# Patient Record
Sex: Female | Born: 1935 | Race: White | Hispanic: No
Health system: Southern US, Community
[De-identification: ages and names within clinical notes are randomized; demographics above are authoritative.]

## PROBLEM LIST (undated history)

## (undated) DIAGNOSIS — N289 Disorder of kidney and ureter, unspecified: Secondary | ICD-10-CM

## (undated) DIAGNOSIS — C801 Malignant (primary) neoplasm, unspecified: Secondary | ICD-10-CM

## (undated) DIAGNOSIS — I10 Essential (primary) hypertension: Secondary | ICD-10-CM

---

## 1997-12-16 ENCOUNTER — Other Ambulatory Visit: Admission: RE | Admit: 1997-12-16 | Discharge: 1997-12-16 | Payer: Self-pay | Admitting: Family Medicine

## 1998-06-04 ENCOUNTER — Ambulatory Visit (HOSPITAL_COMMUNITY): Admission: RE | Admit: 1998-06-04 | Discharge: 1998-06-04 | Payer: Self-pay | Admitting: Family Medicine

## 1998-06-04 ENCOUNTER — Encounter: Payer: Self-pay | Admitting: Family Medicine

## 1998-09-01 ENCOUNTER — Ambulatory Visit (HOSPITAL_COMMUNITY): Admission: RE | Admit: 1998-09-01 | Discharge: 1998-09-01 | Payer: Self-pay | Admitting: Obstetrics & Gynecology

## 1999-01-05 ENCOUNTER — Encounter: Admission: RE | Admit: 1999-01-05 | Discharge: 1999-04-05 | Payer: Self-pay | Admitting: Neurosurgery

## 1999-02-04 ENCOUNTER — Encounter: Payer: Self-pay | Admitting: Emergency Medicine

## 1999-02-04 ENCOUNTER — Emergency Department (HOSPITAL_COMMUNITY): Admission: EM | Admit: 1999-02-04 | Discharge: 1999-02-04 | Payer: Self-pay | Admitting: Emergency Medicine

## 1999-02-28 ENCOUNTER — Encounter: Admission: RE | Admit: 1999-02-28 | Discharge: 1999-02-28 | Payer: Self-pay | Admitting: Family Medicine

## 1999-06-08 ENCOUNTER — Ambulatory Visit (HOSPITAL_COMMUNITY): Admission: RE | Admit: 1999-06-08 | Discharge: 1999-06-08 | Payer: Self-pay | Admitting: Family Medicine

## 1999-06-08 ENCOUNTER — Encounter: Payer: Self-pay | Admitting: Family Medicine

## 2000-04-19 ENCOUNTER — Encounter (INDEPENDENT_AMBULATORY_CARE_PROVIDER_SITE_OTHER): Payer: Self-pay | Admitting: Specialist

## 2000-04-19 ENCOUNTER — Other Ambulatory Visit: Admission: RE | Admit: 2000-04-19 | Discharge: 2000-04-19 | Payer: Self-pay | Admitting: Gastroenterology

## 2000-06-13 ENCOUNTER — Ambulatory Visit (HOSPITAL_COMMUNITY): Admission: RE | Admit: 2000-06-13 | Discharge: 2000-06-13 | Payer: Self-pay | Admitting: Family Medicine

## 2000-06-13 ENCOUNTER — Encounter: Payer: Self-pay | Admitting: Family Medicine

## 2000-08-16 ENCOUNTER — Inpatient Hospital Stay (HOSPITAL_COMMUNITY): Admission: EM | Admit: 2000-08-16 | Discharge: 2000-08-20 | Payer: Self-pay | Admitting: Emergency Medicine

## 2000-08-16 ENCOUNTER — Encounter: Payer: Self-pay | Admitting: Emergency Medicine

## 2000-08-16 ENCOUNTER — Encounter: Payer: Self-pay | Admitting: Internal Medicine

## 2001-02-18 ENCOUNTER — Encounter: Payer: Self-pay | Admitting: Urology

## 2001-02-18 ENCOUNTER — Encounter: Admission: RE | Admit: 2001-02-18 | Discharge: 2001-02-18 | Payer: Self-pay | Admitting: Urology

## 2001-02-19 ENCOUNTER — Ambulatory Visit (HOSPITAL_BASED_OUTPATIENT_CLINIC_OR_DEPARTMENT_OTHER): Admission: RE | Admit: 2001-02-19 | Discharge: 2001-02-19 | Payer: Self-pay | Admitting: Urology

## 2001-03-12 ENCOUNTER — Ambulatory Visit (HOSPITAL_BASED_OUTPATIENT_CLINIC_OR_DEPARTMENT_OTHER): Admission: RE | Admit: 2001-03-12 | Discharge: 2001-03-12 | Payer: Self-pay | Admitting: Urology

## 2001-06-19 ENCOUNTER — Encounter: Payer: Self-pay | Admitting: Family Medicine

## 2001-06-19 ENCOUNTER — Ambulatory Visit (HOSPITAL_COMMUNITY): Admission: RE | Admit: 2001-06-19 | Discharge: 2001-06-19 | Payer: Self-pay | Admitting: Family Medicine

## 2002-07-14 ENCOUNTER — Ambulatory Visit (HOSPITAL_COMMUNITY): Admission: RE | Admit: 2002-07-14 | Discharge: 2002-07-14 | Payer: Self-pay | Admitting: Family Medicine

## 2002-07-14 ENCOUNTER — Encounter: Payer: Self-pay | Admitting: Family Medicine

## 2003-08-12 ENCOUNTER — Ambulatory Visit (HOSPITAL_COMMUNITY): Admission: RE | Admit: 2003-08-12 | Discharge: 2003-08-12 | Payer: Self-pay | Admitting: Family Medicine

## 2004-08-15 ENCOUNTER — Ambulatory Visit (HOSPITAL_COMMUNITY): Admission: RE | Admit: 2004-08-15 | Discharge: 2004-08-15 | Payer: Self-pay | Admitting: General Surgery

## 2004-10-17 ENCOUNTER — Ambulatory Visit (HOSPITAL_COMMUNITY): Admission: RE | Admit: 2004-10-17 | Discharge: 2004-10-17 | Payer: Self-pay | Admitting: Cardiology

## 2004-12-06 ENCOUNTER — Inpatient Hospital Stay (HOSPITAL_COMMUNITY): Admission: RE | Admit: 2004-12-06 | Discharge: 2004-12-07 | Payer: Self-pay | Admitting: Cardiology

## 2005-10-03 ENCOUNTER — Ambulatory Visit (HOSPITAL_COMMUNITY): Admission: RE | Admit: 2005-10-03 | Discharge: 2005-10-03 | Payer: Self-pay | Admitting: Family Medicine

## 2006-01-01 ENCOUNTER — Encounter: Admission: RE | Admit: 2006-01-01 | Discharge: 2006-01-01 | Payer: Self-pay | Admitting: Cardiology

## 2006-01-02 ENCOUNTER — Ambulatory Visit: Payer: Self-pay | Admitting: Internal Medicine

## 2006-01-16 ENCOUNTER — Ambulatory Visit (HOSPITAL_COMMUNITY): Admission: RE | Admit: 2006-01-16 | Discharge: 2006-01-16 | Payer: Self-pay | Admitting: Internal Medicine

## 2006-01-17 ENCOUNTER — Ambulatory Visit (HOSPITAL_COMMUNITY): Admission: RE | Admit: 2006-01-17 | Discharge: 2006-01-17 | Payer: Self-pay | Admitting: Urology

## 2006-02-06 LAB — CBC WITH DIFFERENTIAL/PLATELET
Basophils Absolute: 0 10*3/uL (ref 0.0–0.1)
Eosinophils Absolute: 0.4 10*3/uL (ref 0.0–0.5)
HCT: 33.1 % — ABNORMAL LOW (ref 34.8–46.6)
HGB: 11.3 g/dL — ABNORMAL LOW (ref 11.6–15.9)
LYMPH%: 32.6 % (ref 14.0–48.0)
MONO#: 0.5 10*3/uL (ref 0.1–0.9)
NEUT#: 3 10*3/uL (ref 1.5–6.5)
NEUT%: 51.4 % (ref 39.6–76.8)
Platelets: 225 10*3/uL (ref 145–400)
RBC: 3.7 10*6/uL (ref 3.70–5.32)
WBC: 5.9 10*3/uL (ref 3.9–10.0)

## 2006-02-06 LAB — COMPREHENSIVE METABOLIC PANEL
CO2: 26 mEq/L (ref 19–32)
Glucose, Bld: 122 mg/dL — ABNORMAL HIGH (ref 70–99)
Sodium: 141 mEq/L (ref 135–145)
Total Bilirubin: 0.4 mg/dL (ref 0.3–1.2)
Total Protein: 6.4 g/dL (ref 6.0–8.3)

## 2006-02-06 LAB — LACTATE DEHYDROGENASE: LDH: 186 U/L (ref 94–250)

## 2006-02-14 ENCOUNTER — Encounter (INDEPENDENT_AMBULATORY_CARE_PROVIDER_SITE_OTHER): Payer: Self-pay | Admitting: Specialist

## 2006-02-14 ENCOUNTER — Inpatient Hospital Stay (HOSPITAL_COMMUNITY): Admission: RE | Admit: 2006-02-14 | Discharge: 2006-02-25 | Payer: Self-pay | Admitting: Urology

## 2006-02-21 ENCOUNTER — Ambulatory Visit: Payer: Self-pay | Admitting: Physical Medicine & Rehabilitation

## 2006-02-24 ENCOUNTER — Encounter: Payer: Self-pay | Admitting: *Deleted

## 2006-03-22 ENCOUNTER — Ambulatory Visit: Payer: Self-pay | Admitting: Internal Medicine

## 2006-03-26 LAB — CBC WITH DIFFERENTIAL/PLATELET
Eosinophils Absolute: 1 10*3/uL — ABNORMAL HIGH (ref 0.0–0.5)
MONO#: 0.5 10*3/uL (ref 0.1–0.9)
NEUT#: 3.6 10*3/uL (ref 1.5–6.5)
Platelets: 229 10*3/uL (ref 145–400)
RBC: 3.78 10*6/uL (ref 3.70–5.32)
RDW: 14.2 % (ref 11.3–14.5)
WBC: 7.5 10*3/uL (ref 3.9–10.0)
lymph#: 2.3 10*3/uL (ref 0.9–3.3)

## 2006-03-26 LAB — COMPREHENSIVE METABOLIC PANEL
Alkaline Phosphatase: 75 U/L (ref 39–117)
BUN: 42 mg/dL — ABNORMAL HIGH (ref 6–23)
Creatinine, Ser: 1.42 mg/dL — ABNORMAL HIGH (ref 0.40–1.20)
Glucose, Bld: 146 mg/dL — ABNORMAL HIGH (ref 70–99)
Total Bilirubin: 0.3 mg/dL (ref 0.3–1.2)

## 2006-07-03 ENCOUNTER — Ambulatory Visit (HOSPITAL_COMMUNITY): Admission: RE | Admit: 2006-07-03 | Discharge: 2006-07-03 | Payer: Self-pay | Admitting: Urology

## 2006-09-18 ENCOUNTER — Ambulatory Visit: Payer: Self-pay | Admitting: Internal Medicine

## 2006-09-20 ENCOUNTER — Ambulatory Visit (HOSPITAL_COMMUNITY): Admission: RE | Admit: 2006-09-20 | Discharge: 2006-09-20 | Payer: Self-pay | Admitting: Internal Medicine

## 2006-09-24 LAB — CBC WITH DIFFERENTIAL/PLATELET
BASO%: 0.7 % (ref 0.0–2.0)
HCT: 30.9 % — ABNORMAL LOW (ref 34.8–46.6)
MCHC: 34.3 g/dL (ref 32.0–36.0)
MONO#: 0.5 10*3/uL (ref 0.1–0.9)
NEUT%: 45.1 % (ref 39.6–76.8)
RBC: 3.4 10*6/uL — ABNORMAL LOW (ref 3.70–5.32)
WBC: 5.9 10*3/uL (ref 3.9–10.0)
lymph#: 2 10*3/uL (ref 0.9–3.3)

## 2006-09-24 LAB — COMPREHENSIVE METABOLIC PANEL
AST: 23 U/L (ref 0–37)
Albumin: 3.1 g/dL — ABNORMAL LOW (ref 3.5–5.2)
BUN: 21 mg/dL (ref 6–23)
CO2: 27 mEq/L (ref 19–32)
Calcium: 8.8 mg/dL (ref 8.4–10.5)
Chloride: 108 mEq/L (ref 96–112)
Glucose, Bld: 231 mg/dL — ABNORMAL HIGH (ref 70–99)
Potassium: 4.7 mEq/L (ref 3.5–5.3)

## 2006-09-24 LAB — LACTATE DEHYDROGENASE: LDH: 211 U/L (ref 94–250)

## 2006-10-05 ENCOUNTER — Ambulatory Visit (HOSPITAL_COMMUNITY): Admission: RE | Admit: 2006-10-05 | Discharge: 2006-10-05 | Payer: Self-pay | Admitting: Family Medicine

## 2007-01-22 IMAGING — CR DG CHEST 1V PORT
1 series · 1 of 1 positions shown · non-contrast
Comparison: none

CLINICAL DATA: Left renal mass and elevated blood pressure.  Preoperative respiratory exam. 
 PORTABLE CHEST - 1 VIEW - 02/20/06 AT 0111 HOURS:
 Comparison chest x-ray is dated 12/06/04.

[view not recorded]
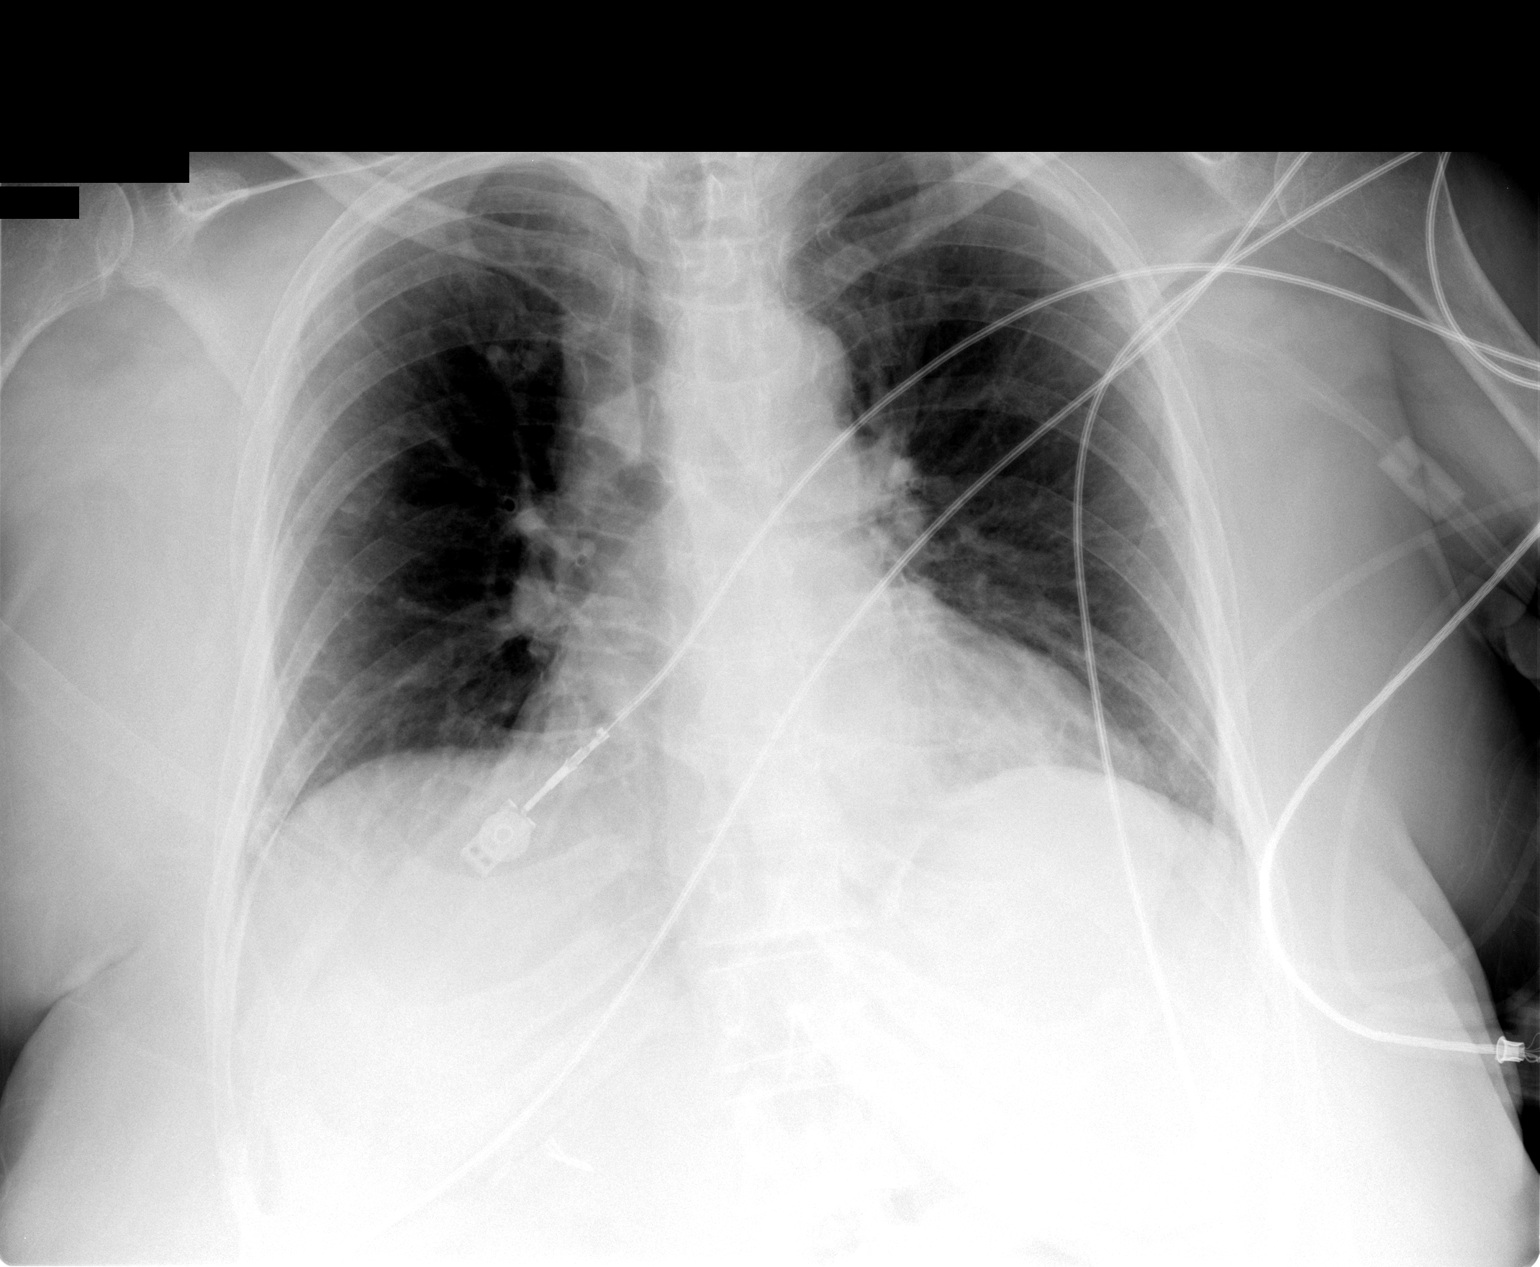

[1 of 1 positions shown; findings below may reference images not displayed]

FINDINGS: The cardiac silhouette, mediastinum, and pulmonary vasculature are within normal limits.  Both lungs are clear.
IMPRESSION: Stable chest x-ray with no evidence of acute cardiac or pulmonary process.

## 2007-01-23 IMAGING — CR DG CHEST 2V
2 series · 2 of 2 positions shown · non-contrast
Comparison: Yesterday?s exam.

CLINICAL DATA: Left renal mass.  HBP.  Status-post pacer insertion.
 CHEST - 2 VIEW:

[w chest pa]
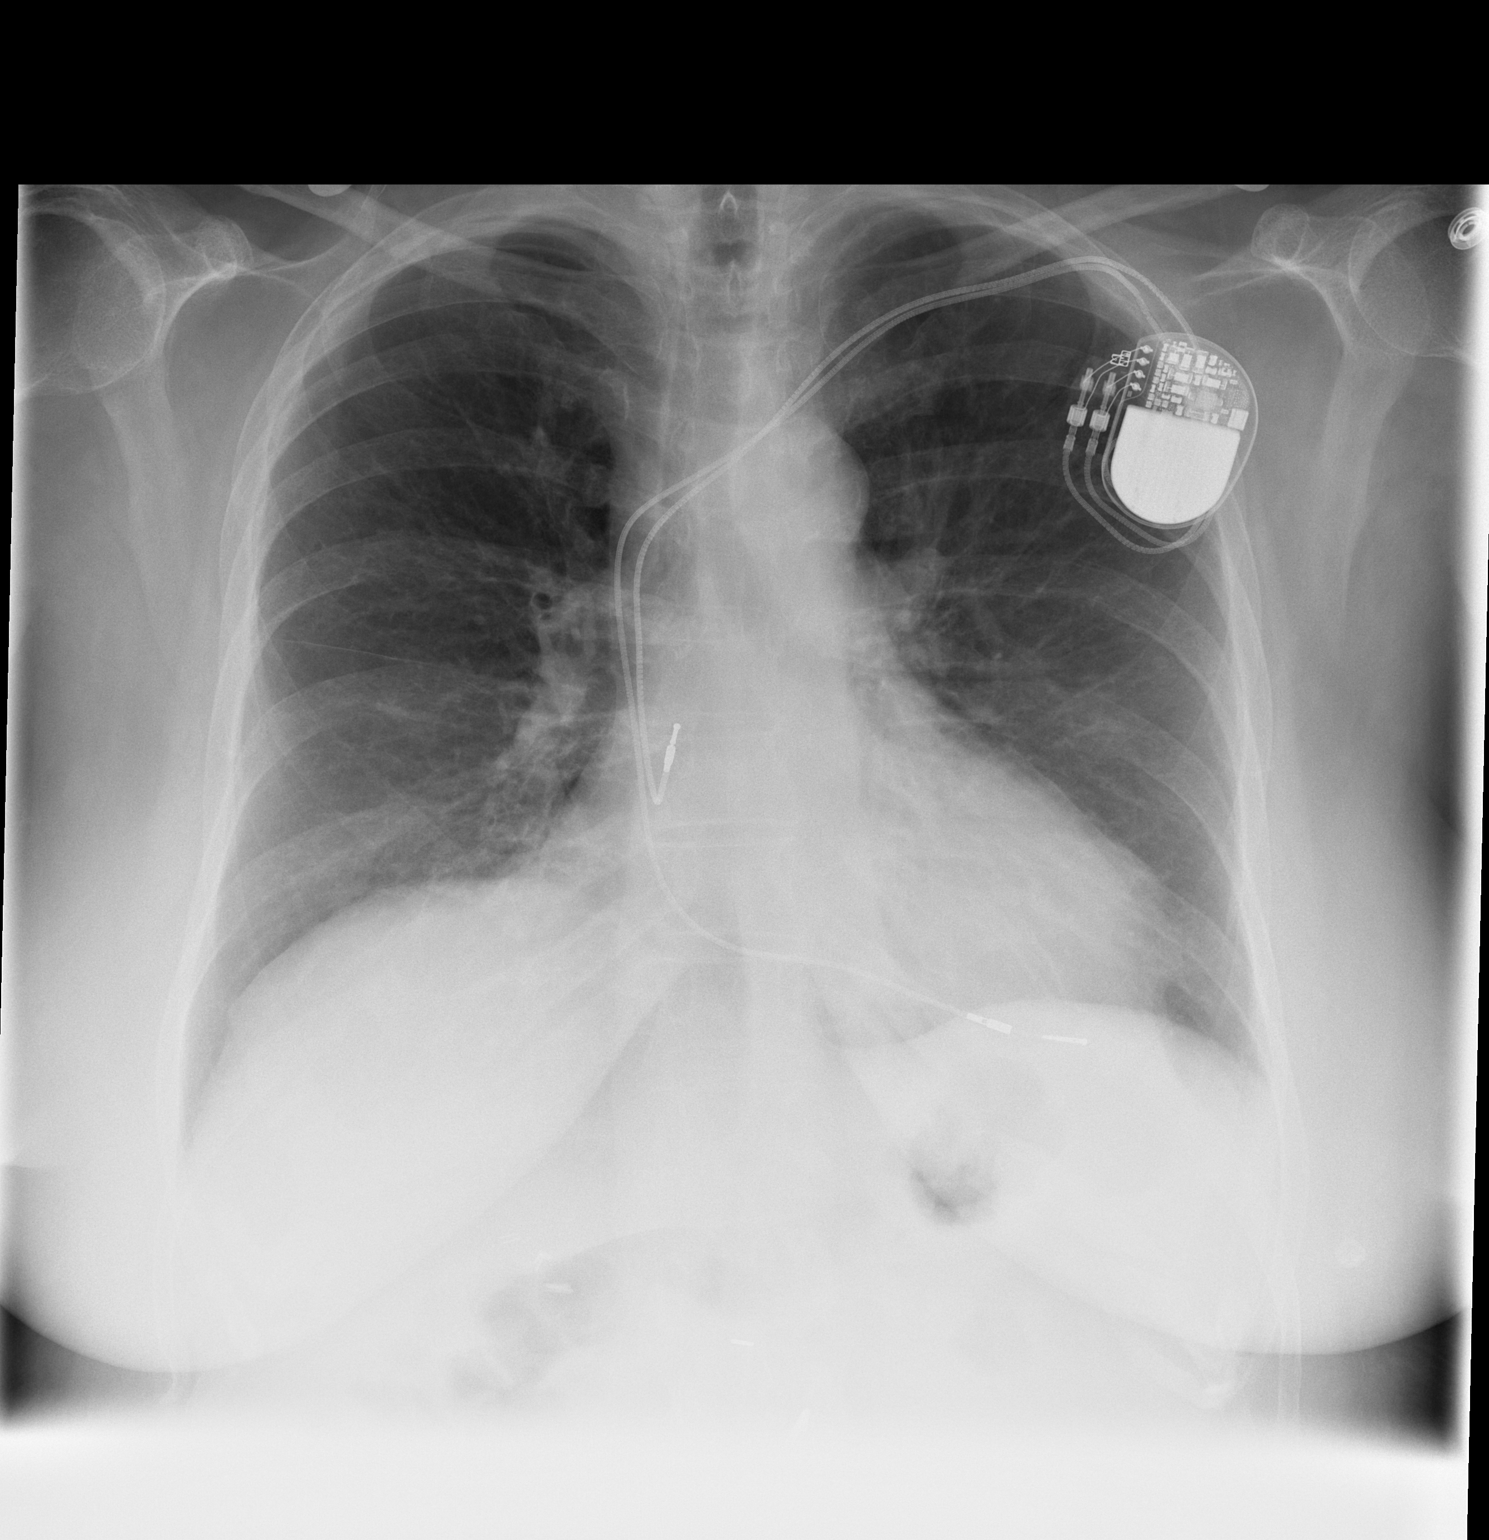

[w chest lat]
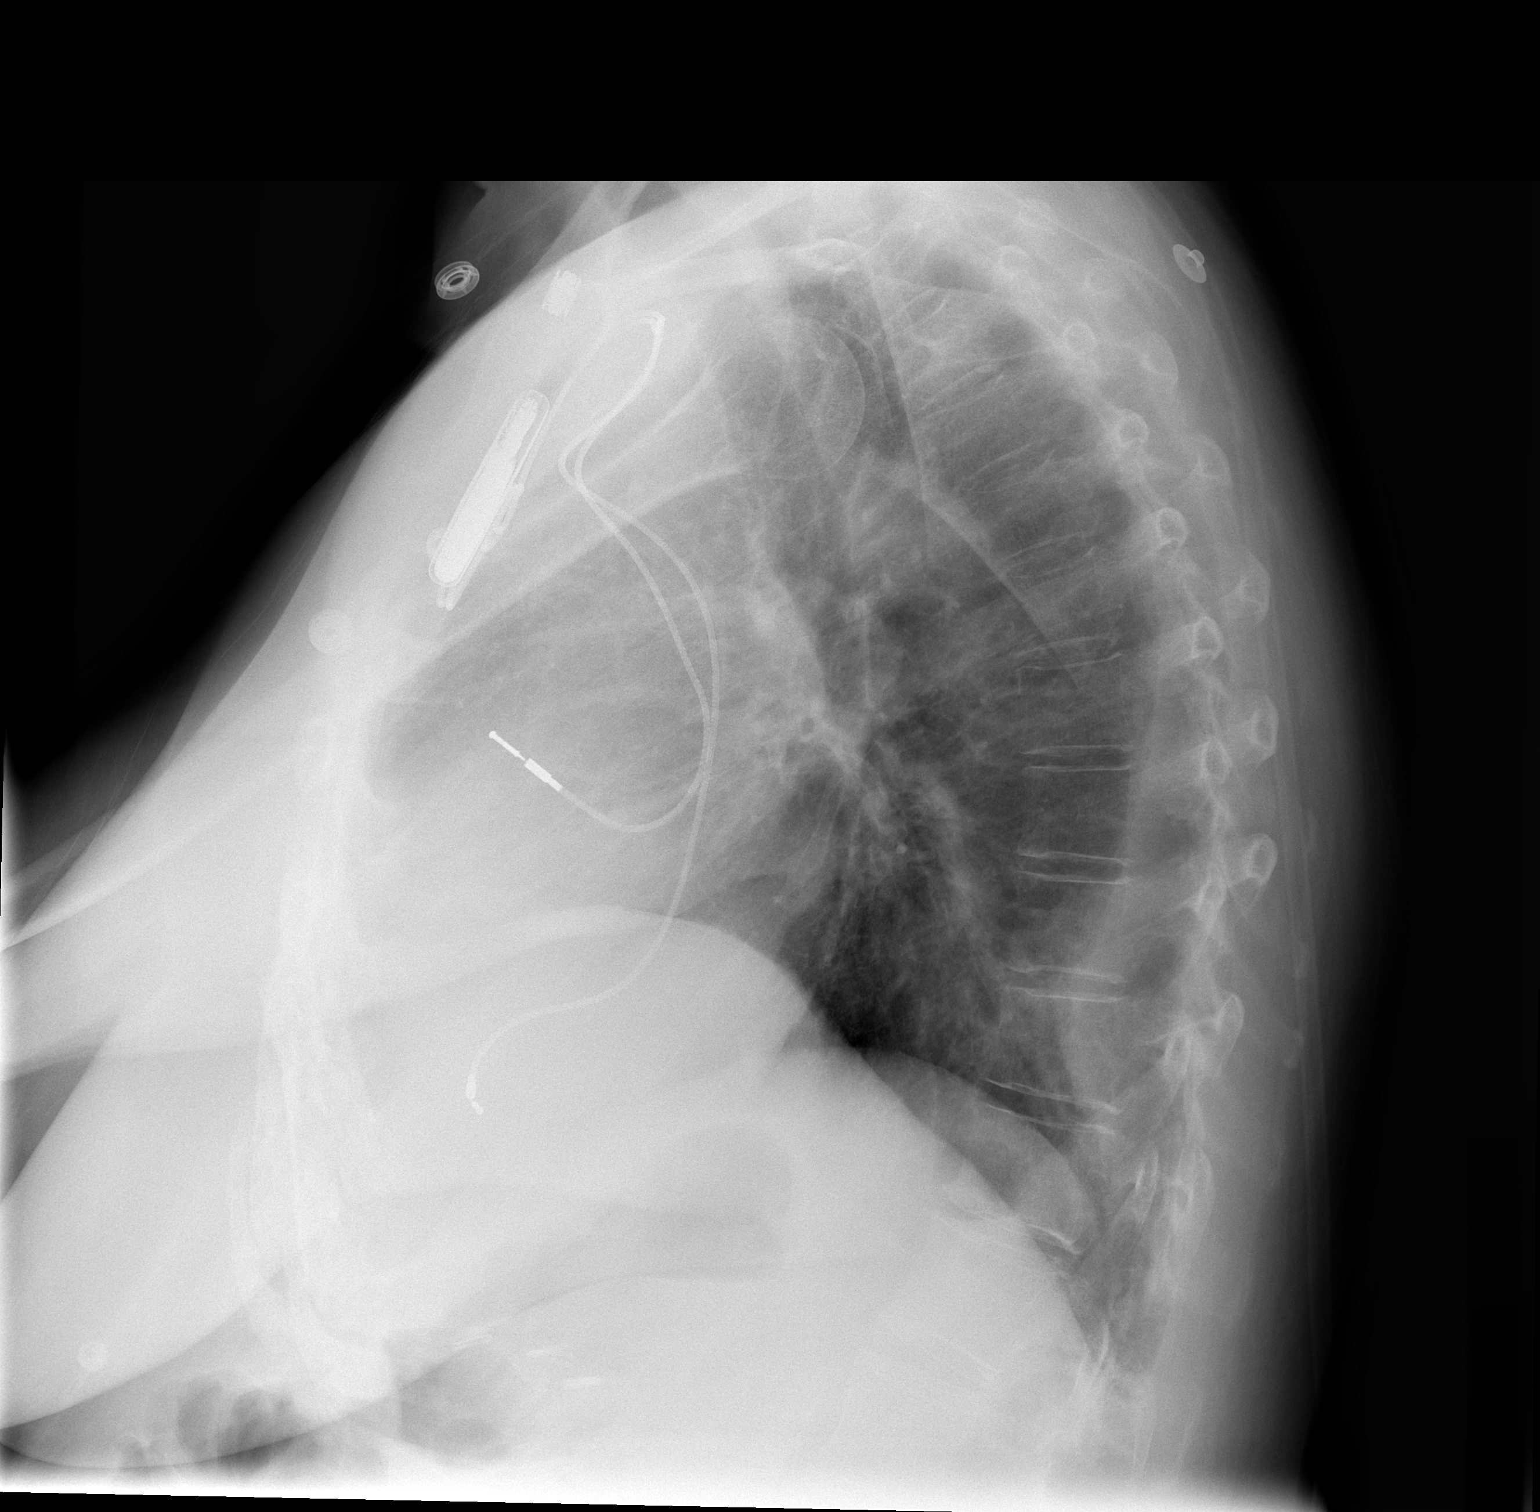

[2 of 2 positions shown; findings below may reference images not displayed]

FINDINGS: Pacer battery pack left pectoral region.  Right atrial and right ventricular pacer leads appear in satisfactory position.  Findings compatible with a small amount of pleural fluid posterior and inferior in a hemithorax.  It is difficult to tell which side the fluid is located on the PA view.
IMPRESSION: Status-post placement of pacemaker.  Suggestion of small loculated pleural effusion posterior aspect of the chest, probably on the left.

## 2007-03-14 ENCOUNTER — Ambulatory Visit: Payer: Self-pay | Admitting: Internal Medicine

## 2007-03-18 ENCOUNTER — Ambulatory Visit (HOSPITAL_COMMUNITY): Admission: RE | Admit: 2007-03-18 | Discharge: 2007-03-18 | Payer: Self-pay | Admitting: Internal Medicine

## 2007-03-18 LAB — COMPREHENSIVE METABOLIC PANEL
AST: 24 U/L (ref 0–37)
Albumin: 3.3 g/dL — ABNORMAL LOW (ref 3.5–5.2)
Alkaline Phosphatase: 74 U/L (ref 39–117)
Glucose, Bld: 122 mg/dL — ABNORMAL HIGH (ref 70–99)
Potassium: 4.3 mEq/L (ref 3.5–5.3)
Sodium: 137 mEq/L (ref 135–145)
Total Protein: 6.2 g/dL (ref 6.0–8.3)

## 2007-03-18 LAB — CBC WITH DIFFERENTIAL/PLATELET
Eosinophils Absolute: 1 10*3/uL — ABNORMAL HIGH (ref 0.0–0.5)
MCV: 89.8 fL (ref 81.0–101.0)
MONO%: 8.5 % (ref 0.0–13.0)
NEUT#: 3.7 10*3/uL (ref 1.5–6.5)
RBC: 3.8 10*6/uL (ref 3.70–5.32)
RDW: 14.1 % (ref 11.3–14.5)
WBC: 7.5 10*3/uL (ref 3.9–10.0)
lymph#: 2.1 10*3/uL (ref 0.9–3.3)

## 2007-06-09 ENCOUNTER — Ambulatory Visit: Payer: Self-pay | Admitting: Internal Medicine

## 2007-09-19 ENCOUNTER — Ambulatory Visit (HOSPITAL_COMMUNITY): Admission: RE | Admit: 2007-09-19 | Discharge: 2007-09-19 | Payer: Self-pay | Admitting: Internal Medicine

## 2007-09-19 LAB — CBC WITH DIFFERENTIAL/PLATELET
Basophils Absolute: 0 10*3/uL (ref 0.0–0.1)
Eosinophils Absolute: 0.5 10*3/uL (ref 0.0–0.5)
HGB: 12.1 g/dL (ref 11.6–15.9)
LYMPH%: 30.3 % (ref 14.0–48.0)
MCV: 88.8 fL (ref 81.0–101.0)
MONO%: 8.3 % (ref 0.0–13.0)
NEUT#: 2.8 10*3/uL (ref 1.5–6.5)
Platelets: 183 10*3/uL (ref 145–400)
RDW: 13.8 % (ref 11.3–14.5)

## 2007-09-19 LAB — LACTATE DEHYDROGENASE: LDH: 200 U/L (ref 94–250)

## 2007-09-19 LAB — COMPREHENSIVE METABOLIC PANEL
Albumin: 3.6 g/dL (ref 3.5–5.2)
Alkaline Phosphatase: 81 U/L (ref 39–117)
BUN: 22 mg/dL (ref 6–23)
Glucose, Bld: 118 mg/dL — ABNORMAL HIGH (ref 70–99)
Potassium: 3.9 mEq/L (ref 3.5–5.3)

## 2007-10-02 ENCOUNTER — Ambulatory Visit (HOSPITAL_COMMUNITY): Admission: RE | Admit: 2007-10-02 | Discharge: 2007-10-02 | Payer: Self-pay | Admitting: Family Medicine

## 2008-03-16 ENCOUNTER — Ambulatory Visit: Payer: Self-pay | Admitting: Internal Medicine

## 2008-03-18 ENCOUNTER — Ambulatory Visit (HOSPITAL_COMMUNITY): Admission: RE | Admit: 2008-03-18 | Discharge: 2008-03-18 | Payer: Self-pay | Admitting: Internal Medicine

## 2008-03-18 LAB — CBC WITH DIFFERENTIAL/PLATELET
Basophils Absolute: 0 10*3/uL (ref 0.0–0.1)
EOS%: 5.6 % (ref 0.0–7.0)
HCT: 35 % (ref 34.8–46.6)
HGB: 11.9 g/dL (ref 11.6–15.9)
MCH: 31.4 pg (ref 26.0–34.0)
MCV: 92.4 fL (ref 81.0–101.0)
MONO%: 8.7 % (ref 0.0–13.0)
NEUT%: 57.6 % (ref 39.6–76.8)

## 2008-03-18 LAB — COMPREHENSIVE METABOLIC PANEL
AST: 23 U/L (ref 0–37)
Alkaline Phosphatase: 70 U/L (ref 39–117)
BUN: 20 mg/dL (ref 6–23)
Creatinine, Ser: 1.19 mg/dL (ref 0.40–1.20)
Total Bilirubin: 0.6 mg/dL (ref 0.3–1.2)

## 2008-09-10 ENCOUNTER — Ambulatory Visit: Payer: Self-pay | Admitting: Internal Medicine

## 2008-09-14 ENCOUNTER — Ambulatory Visit (HOSPITAL_COMMUNITY): Admission: RE | Admit: 2008-09-14 | Discharge: 2008-09-14 | Payer: Self-pay | Admitting: Internal Medicine

## 2008-09-14 LAB — CBC WITH DIFFERENTIAL/PLATELET
BASO%: 0.7 % (ref 0.0–2.0)
Basophils Absolute: 0.1 10*3/uL (ref 0.0–0.1)
EOS%: 7.9 % — ABNORMAL HIGH (ref 0.0–7.0)
HGB: 12.2 g/dL (ref 11.6–15.9)
MCH: 30.1 pg (ref 25.1–34.0)
MCHC: 33.3 g/dL (ref 31.5–36.0)
MCV: 90.4 fL (ref 79.5–101.0)
MONO%: 6.1 % (ref 0.0–14.0)
RBC: 4.05 10*6/uL (ref 3.70–5.45)
RDW: 13 % (ref 11.2–14.5)

## 2008-09-14 LAB — COMPREHENSIVE METABOLIC PANEL
ALT: 23 U/L (ref 0–35)
AST: 27 U/L (ref 0–37)
Albumin: 3.7 g/dL (ref 3.5–5.2)
Alkaline Phosphatase: 86 U/L (ref 39–117)
BUN: 25 mg/dL — ABNORMAL HIGH (ref 6–23)
Potassium: 3.8 mEq/L (ref 3.5–5.3)

## 2009-09-15 ENCOUNTER — Ambulatory Visit: Payer: Self-pay | Admitting: Internal Medicine

## 2009-09-30 ENCOUNTER — Ambulatory Visit (HOSPITAL_COMMUNITY): Admission: RE | Admit: 2009-09-30 | Discharge: 2009-09-30 | Payer: Self-pay | Admitting: Internal Medicine

## 2009-10-05 LAB — COMPREHENSIVE METABOLIC PANEL
ALT: 19 U/L (ref 0–35)
AST: 23 U/L (ref 0–37)
Albumin: 3.9 g/dL (ref 3.5–5.2)
BUN: 27 mg/dL — ABNORMAL HIGH (ref 6–23)
CO2: 26 mEq/L (ref 19–32)
Calcium: 9.5 mg/dL (ref 8.4–10.5)
Chloride: 104 mEq/L (ref 96–112)
Creatinine, Ser: 1.64 mg/dL — ABNORMAL HIGH (ref 0.40–1.20)
Potassium: 4.6 mEq/L (ref 3.5–5.3)

## 2009-10-05 LAB — CBC WITH DIFFERENTIAL/PLATELET
BASO%: 0.9 % (ref 0.0–2.0)
Eosinophils Absolute: 0.4 10*3/uL (ref 0.0–0.5)
HCT: 35.1 % (ref 34.8–46.6)
HGB: 12.1 g/dL (ref 11.6–15.9)
LYMPH%: 33.7 % (ref 14.0–49.7)
MCHC: 34.6 g/dL (ref 31.5–36.0)
MONO#: 0.5 10*3/uL (ref 0.1–0.9)
NEUT#: 3.4 10*3/uL (ref 1.5–6.5)
NEUT%: 51.9 % (ref 38.4–76.8)
Platelets: 191 10*3/uL (ref 145–400)
WBC: 6.5 10*3/uL (ref 3.9–10.3)
lymph#: 2.2 10*3/uL (ref 0.9–3.3)

## 2009-10-05 LAB — LACTATE DEHYDROGENASE: LDH: 185 U/L (ref 94–250)

## 2010-01-19 ENCOUNTER — Encounter: Admission: RE | Admit: 2010-01-19 | Discharge: 2010-01-19 | Payer: Self-pay | Admitting: Neurology

## 2010-04-29 ENCOUNTER — Other Ambulatory Visit: Payer: Self-pay | Admitting: Internal Medicine

## 2010-04-29 DIAGNOSIS — Z85528 Personal history of other malignant neoplasm of kidney: Secondary | ICD-10-CM

## 2010-05-01 ENCOUNTER — Encounter: Payer: Self-pay | Admitting: Family Medicine

## 2010-05-01 ENCOUNTER — Encounter: Payer: Self-pay | Admitting: Internal Medicine

## 2010-05-01 ENCOUNTER — Encounter: Payer: Self-pay | Admitting: Orthopedic Surgery

## 2010-08-26 NOTE — Op Note (Signed)
[name], [name]                  ACCOUNT NO.:  [id number]   MEDICAL RECORD NO.:  [id number]          PATIENT TYPE:  INP   LOCATION:  0161                         FACILITY:  [facility]   PHYSICIAN:  Terie Purser, MD         DATE OF BIRTH:  13-Jun-1929   DATE OF PROCEDURE:  02/15/2006  DATE OF DISCHARGE:                                 OPERATIVE REPORT   PREOPERATIVE DIAGNOSIS:  Left renal mass.   POSTOPERATIVE DIAGNOSIS:  Left renal mass.   PROCEDURE:  Left radical nephrectomy.   ATTENDING SURGEON:  Darvin Neighbours, M.D.   ASSISTANT:  Terie Purser, M.D.   ANESTHESIA:  General endotracheal.   COMPLICATIONS:  None.   DRAINS:  16-French Foley catheter.   ESTIMATED BLOOD LOSS:  500 mL.   DISPOSITION:  Stable to post anesthesia care unit.   INDICATIONS FOR PROCEDURE:  [name] is a 77 year old female who was  recently found to have a renal mass on incidental ultrasound imaging.  This  has been worked up with CT and MR.  She has a large approximately 9 cm mass  involving the left kidney.  She has been counseled regarding operative  intervention and concern that this is a renal cell carcinoma.  After full  benefits and risks of the procedure were explained she has agreed to  proceed, her consent was obtained.   SPECIMEN:  Left kidney and adrenal gland for pathologic analysis.   DESCRIPTION OF PROCEDURE:  The patient was brought to the operating room  properly identified.  A time-out was performed to confirm correct patient,  procedure and side.  She was then administered general anesthesia, given  preoperative antibiotics and then placed in a modified left flank position  and prepped and draped sterile fashion.  Care was taken to properly pad all  pressure points and position the patient to prevent neuropathy or  compartment syndrome.   We then began by making incision anteriorly as a subcostal incision which  extended laterally to the axillary line.  Dissection was carried down  through the subcutaneous and muscle layers.  Peritoneum was identified and  entered sharply.  The peritoneum was then opened with the Bovie  electrocautery.  There were several adhesions.  This patient had previous  abdominal surgery.  These were taken down sharply to facilitate retraction  of the bowel.  We then placed a Bookwalter retractor.  We then began  mobilizing the colon by incising the white line of Toldt.  As we entered the  retroperitoneal space, we began to mobilize the posterior lateral  attachments to the kidney.  This was quite stuck, given the patient's  previous surgery and we then elected to proceed more anteriorly.  A plane  was developed between the colon and the kidney and we were able to reflect  the colon medially to expose Gerota's fascia and the kidney.  This was  continued down inferiorly where we encountered the gonadal vessel and the  ureter.  These were both doubly clipped and divided.  This significantly  freed up the lower pole  and we were able to gain more mobilization of the  kidney.  We proceeded superiorly along the aorta where we encountered the  renal hilum by identifying the renal vein.  Prior to controlling the hilum,  we did dissect a bit superiorly to provide greater access to the hilum.  We  then isolated the renal vein with a vessel loop.  The renal artery was  identified palpably off of the aorta and into the kidney with the colon and  small bowel reflected.  This was isolated and ligated with a 0 silk suture.  We then proceeded to take the renal vein.  This was triply ligated with 0  silk suture and clipped and then divided sharply.  We then proceeded to  complete the renal artery.  This again was doubly tied with a 0 silk suture  and clipped.  This was then divided sharply.  Hemostasis was excellent at  this point.  We then proceeded superiorly to remove the specimen.  The  adrenal gland was removed with the specimen.  Superior attachments  were  taken down using combination of clips and sharp dissection.  The splenorenal  attachments were taken down using clips and sharp dissection.  Care was  taken to avoid injury to the spleen.  At this point the remaining posterior  lateral attachments were removed and the specimen was sent to pathology for  analysis.  We copiously irrigated the resection bed.  Hemostasis was good.  We placed a sheet of Surgicel over the renal hilum and adrenal resection  sites.  Hemostasis was well-controlled.  We then removed the Bookwalter  retractor.  The tip of the spleen was carefully inspected.  There was no  evidence of injury to the spleen.  The bowel was reflected back to its  normal position.  We then proceeded to close the incision.  Muscle layers  were closed in three layers using #1 PDS.  The incision was then irrigated  again and skin was closed with staples.  All sponge and needle counts were  correct at the end of the case.  There were no complications.  The patient  was then awoken from anesthesia and transported to recovery room in stable  condition.  Please note that Dr. Earlene Plater was present and participated in all  aspects of this procedure as he was primary surgeon.           ______________________________  Terie Purser, MD     JH/MEDQ  D:  02/15/2006  T:  02/15/2006  Job:  [id number]

## 2010-08-26 NOTE — H&P (Signed)
[name]                  ACCOUNT NO.:  [id number]   MEDICAL RECORD NO.:  [id number]          PATIENT TYPE:  INP   LOCATION:  0161                         FACILITY:  [facility]   PHYSICIAN:  Terie Purser, MD         DATE OF BIRTH:  06-Jul-1929   DATE OF ADMISSION:  02/14/2006  DATE OF DISCHARGE:                                HISTORY & PHYSICAL   CHIEF COMPLAINT:  Renal mass.   HISTORY OF PRESENT ILLNESS:  [name] is a pleasant 77 year old female who  is admitted to the hospital today for renal mass.  She has a left-sided mass  that was originally found on a Doppler ultrasound.  This has been worked up  with CT and MRI which shows a large, approximately 9 cm, mass involving the  left kidney.  She has had some associated left lower quadrant pain.  She  denies any history of hematuria.  She has some frequency, hesitancy and some  urgency for lower urinary tract symptoms but this is mild.  She is admitted  today for surgical intervention.  She has otherwise been doing well without  recent fever or chills, other signs of illness or changes in her medical  history.   ALLERGIES:  1. CODEINE.  2. MORPHINE.  3. SYNTHROID.   MEDICATIONS:  Per the medicine reconciliation sheet.   PAST MEDICAL HISTORY:  1. Hypertension.  2. Diabetes.  3. Hypothyroidism.  4. Hyperlipidemia.  5. Coronary artery disease.   PAST SURGICAL HISTORY:  1. Cholecystectomy.  2. Hysterectomy.  3. Appendectomy.  4. Vascular tumor.  5. Ankle surgery.  6. Sling bladder procedure.   FAMILY MEDICAL HISTORY:  Negative.   SOCIAL HISTORY:  The patient lives alone.  Denies any alcohol, tobacco or  drug use.   REVIEW OF SYSTEMS:  Is as above, otherwise reviewed and negative.   EXAMINATION:  The patient is afebrile with stable vital signs.  In general,  she is in no acute distress.  HEART:  Regular.  LUNGS:  Clear.  ABDOMEN:  Soft and nontender.  She is obese.  EXTREMITIES:  Normal.  NEURO:  Alert and  oriented x3.  EXTERNAL GENITALIA:  Normal.   ASSESSMENT AND PLAN:  A 77 year old female with large left-sided renal mass.  We will plan to take the patient to the operating room for left radical  nephrectomy today.  She will be admitted to the hospital for postoperative  care.  She does have a history of cardiac disease and has seen Dr. Tresa Endo who  is her cardiologist.  We will have him follow along with her  postoperatively.           ______________________________  Terie Purser, MD     JH/MEDQ  D:  02/14/2006  T:  02/15/2006  Job:  [id number]

## 2010-08-26 NOTE — Consult Note (Signed)
[name], [name]                  ACCOUNT NO.:  [id number]   MEDICAL RECORD NO.:  [id number]          PATIENT TYPE:  INP   LOCATION:  0161                         [facility]ACILITY:  [facility]   PHYSICIAN:  Hollice Espy, M.D.DATE OF BIRTH:  1929-08-24   DATE OF CONSULTATION:  02/15/2006  DATE OF DISCHARGE:                                   CONSULTATION   REASON FOR CONSULTATION:  Diabetes management, elevated blood sugars.   HISTORY OF PRESENT ILLNESS:  The patient is a 77 year old white female with  past medical history of hypertension and poorly controlled diabetes plus  hypothyroidism who was admitted on November 7 after she was found to have a  renal mass on her left kidney which had been worked up and was felt to be  concerning for malignancy.  She was admitted and taken to the operating room  for a left radical nephrectomy.  She underwent the procedure and tolerated  it without incident.  She was also followed by Dr. Tresa Endo who is her  cardiologist for episodes of bradycardia and pauses.  Post procedure, she  has been doing well.  She has been made n.p.o. on ice chips only, however,  she was put on D5 normal saline.  Since her hospitalization, the patient was  noted to have hyperglycemic events with sugars ranging anywhere from 230 up  to 303.  Therefore, [facility] were called for a diabetes  management.  The patient, herself, is doing well.  She is quite tired and  quite sore and complains that her PCA pump, at times, is not fully working.  She states that other than her pain, she is doing well.  When asked in  regards to her diabetes, she says it is not very well controlled, she has  ups and downs often.  Otherwise, she has been well.  She has had no chest  pain and no shortness of breath.  She is quite tired and unable to give much  more review of systems than that.   PAST MEDICAL HISTORY:  Diabetes mellitus status post left radical  nephrectomy yesterday,  hypertension, hypothyroidism, hyperlipidemia and CAD.  She is status post history of a cholecystectomy, hysterectomy, appendectomy,  and vascular tumor.   MEDICATIONS:  The patient is on Lantus 40 units subcu q.h.s. plus NovoLog  sliding scale three times a day with meals.  Please note it lists that her  Lantus is at 50 units but she told me this was scaled back recently to 40.  Cardura 8 mg p.o. b.i.d., Norvasc 5 p.o. daily, Quinapril 40 p.o. daily,  Maxzide 37.5/25 1/2 tab p.o. daily, Armour Thyroid 90 mg p.o. daily,  Primidone 50 mg p.o. b.i.d., Pindolol 5 mg p.o. daily in the morning and 2.5  at night, Lescol 40 p.o. daily, and Reglan 5 mg p.o. b.i.d.   ALLERGIES:  The patient is allergic to SYNTHROID which causes nausea,  MORPHINE AND CODEINE which causes nausea and vomiting.   SOCIAL HISTORY:  She denies any tobacco, alcohol, or drug use.   FAMILY HISTORY:  Noncontributory.  PHYSICAL EXAMINATION:  VITAL SIGNS:  The patient's most recent set of vital signs reveals  temperature 97.2, heart rate 64, blood pressure 155/43, respirations 12, O2  saturation 98% on 4 liters.  Her most recent blood sugars have been 258,  303, 275, and 230, these are being checked every 4 hours.  HEENT:  The patient is normocephalic and atraumatic, her mucous membranes  are dry.  NECK:  No carotid bruits.  HEART:  Regular rate and rhythm, 2/6 systolic ejection murmur.  LUNGS:  Decreased secondary to her body habitus and lying position but  appear to be clear.  ABDOMEN:  Soft, obese, nontender, decreased bowel sounds.  EXTREMITIES:  She has some PAS hose on, but moderate peripheral pulses.  She  tells me that she has neuropathy in her lower extremities.   LABORATORY DATA:  White count 7, hemoglobin 9, hematocrit 26, MCV 91,  platelet count 150.  Sodium 133, potassium 4.6, chloride 102, bicarb 27, BUN  14, creatinine 1.4, glucose 99.   ASSESSMENT AND PLAN:  Hyperglycemia with diabetes mellitus.   Likely the  cause of this is the patient is not on any base insulin other than sliding  scale but was on D5.  This has since been changed.  Given the fact that she  has now been changed over to normal saline without any dextrose, we will  still put her on base insulin but at a very low dose because she will have  some hyperglycemia from stress dose.  We will start with Lantus 5 units  subcu q.h.s. and continue q.4h. as sliding scale.  We will continue to  follow her sugars on a regular basis.  The plan for now is to keep her  n.p.o. for a possible pacer that will be put on at some point next week.  We  will continue to follow this and adjust her diabetes sliding scale and base  Lantus accordingly.  The rest of her cardiology issues will be managed by  cardiologist.  Her surgical issues will be managed by urology.      Hollice Espy, M.D.  Electronically Signed     SKK/MEDQ  D:  02/15/2006  T:  02/15/2006  Job:  [id number]   cc:   Reather Littler, M.D.  Fax: [phone]   Lucrezia Starch. Earlene Plater, M.D.  Fax: [phone]   Nicki Guadalajara, M.D.  Fax: [phone]

## 2010-08-26 NOTE — Discharge Summary (Signed)
[facility]  Patient:    [name], [name]                         MRN: [id number] Adm. Date:  [id number] Disc. Date: [id number] Attending:  Selina Cooley CC:         Tammy R. Collins Scotland, M.D.  Julieanne Manson, M.D.  Lennette Bihari, M.D.   Discharge Summary  DATE OF BIRTH:  Dec 05, 1929  DISCHARGE DIAGNOSES:  1. Bradycardia and hypotension.     a. Secondary to medications.     b. Third degree heart block.  2. Transient hypoxia secondary to mild congestive heart failure and     atelectasis.     a. Discharge oxygen saturations 92-94%.  3. Chronic nausea.     a. Probable gastroparesis.     b. Responded to Reglan.  4. Diabetes mellitus, insulin requiring.     a. Duration greater than 20 years.  5. Uncontrolled hypertension.     a. Occurred after AV nodals were stopped.     b. Hypothyroidism, on replacement.     c. TSH 4.9.  6. Mild renal insufficiency.     a. Admission creatinine 1.4, discharge creatinine of 0.8.     b. Probable hypertension/dehydration related.  7. Gastroesophageal reflux disease, currently asymptomatic.  8. Left arm and shoulder arthritis/bursitis.  9. Glaucoma. 10. Status post hysterectomy.  DISCHARGE MEDICATIONS:  1. Premarin 0.625 mg q.d.  2. Accupril 40 mg q. 8 a.m. and 8 p.m.  3. Lasix 40 mg q.d.  4. Enteric coated aspirin 325 mg at 8 a.m.  5. Timoptic 0.5% one drop in each eye at bedtime.  6. (New) Visken (pindolol) 5 mg - 1/2 tablet 8 a.m. and 8 p.m.  7. Armour thyroid 30 mg at bedtime.  8. (New) Reglan 10 mg 1/2 tab q.a.c. and h.s.  9. (New) Potassium 20 mEq 1/2 tab q.a.m. 10. (New) Norvasc 10 mg q. daily at 10 a.m. 11. Cardura 4 mg q.d. 12. Insulin NPH 25 units in the morning and 10 units in the evening. *** Do not take Tiazac, clonidine, Lopressor or amibid.  CONDITION ON DISCHARGE:  Stable. No recurrent heart block. Ambulating without difficulty. Oxygen saturations 94% on room air.  DISPOSITION:  Home with  family.  RECOMMENDED ACTIVITY:  As tolerated.  RECOMMENDED DIET:  Low fat low salt diabetic diet as before.  FOLLOW-UP:  Dr. Herb Grays on Thursday, May 16 at 12:10. At that visit, her blood pressure and heart rate should be reassessed as well as any questions answered concerning her new medications.  LABORATORY DATA:  Labs pending at the time of discharge:  Ferritin, B12, RBC folate.  CONSULTATIONS:  Cardiologist, Dr. Julieanne Manson and Dr. Daphene Jaeger.  PROCEDURES:  1. A 2-D echocardiogram (May 10):  Overall LV systolic function normal.     Ejection fraction 55-65%. LV wall thickness mildly increased. Aortic valve     thickness was mildly increased. There was mild aortic valvular     regurgitation.  2. Chest x-ray (May 9): Cardiomegaly and probable mild interstitial edema     as well as basilar linear atelectasis.  HOSPITAL COURSE BY PROBLEM: #1 - BRADYCARDIA/HYPOTENSION. [name] is a 77 year old Caucasian female with a history of hypertension who presents with weakness. In the emergency room, she is noted to have a heart rate in the 60s with a blood pressure of 100/30. She was on the following AV nodal blockers, tiazac and  Lopressor. She was also on clonidine which can cause bradycardia. These medications were stopped and she was admitted to the intensive care unit for observation. She was noted in a third degree heart block. Cardiology was involved. After withholding the above medications, her blood pressure and heart rate came up and in fact she became significantly hypertensive. This was probably some beta blocker withdrawal and therefore pindolol was added back at a very low dose. We have maintained her off tiazac and clonidine. She has remained stable throughout her hospital stay. Cardiology will not see her in follow-up unless Dr. Collins Scotland feels this is necessary. They would be happy to in this case.  #2 - UNCONTROLLED HYPERTENSION.  After stopping her AV nodal blockers as  well as the clonidine, [name] blood pressure did go up. We continued her Accupril 40 mg twice a day as well as her Cardura and Lasix. We added Norvasc 10 mg each morning and on the day of discharge her blood pressure line was 177/80 with a heart rate of 80, sitting 157/64 with a heart rate of 91 and standing 143/69 with a heart rate of 100. She will follow-up on Friday with Dr. Collins Scotland for a recheck.  #3 - DIABETES MELLITUS.  [name] has had fair control here in the hospital. We will resume her home dose at the time of discharge.  #4 - NAUSEA.  [name] describes a history of chronic nausea. Given her long standing diabetes, we opted to try Reglan empirically. She seemed to respond to this medication and will go home on 5 mg q.a.c. and h.s. She was warned of the side effects and will stop the medication immediately if she has any symptoms of muscle spasms.  #5 - TRANSIENT HYPOXIA. [name] had some mild congestive heart failure on admission, no doubt secondary to the low heart rate. She received some Lasix and this improved. She also had an element of atelectasis. Incentive spirometry was used and at the time of discharge her lungs were clear and her oxygen saturations were normal.  #6 - MILD RENAL INSUFFICIENCY.  [name] was mildly dehydrated on presentation and had a creatinine of 1.4. This is above her baseline and at the time of discharge she was 0.8.  #7 - NORMOCYTIC ANEMIA.  [name] had a hemoglobin of 11.6 with an MCV of 86. She was guaiac negative this admission. Reticulocyte count was 1.6% and ferritin, B12 and RBC folate were pending at the time of discharge.  DISCHARGE LABORATORY DATA:  Sodium 139, potassium 3.9, chloride 98, bicarb 34, BUN 14, creatinine 0.8. Hemoglobin 11.6, MCV 86.   Spent greater than 30 minutes arranging discharge and dictating. DD:  08/20/00 TD:  08/21/00 Job: [id number]

## 2010-08-26 NOTE — Op Note (Signed)
[facility]  Patient:    [name], [name] Visit Number: [id number] MRN: [id number]          Service Type: NES Location: [facility] Attending Physician:  Londell Moh Dictated by:   Jamison Neighbor, M.D. Proc. Date: 03/12/01 Admit Date:  03/12/2001                             Operative Report  PREOPERATIVE DIAGNOSIS:  Stress urinary incontinence.  POSTOPERATIVE DIAGNOSIS:  Stress urinary incontinence.  PROCEDURE:  Cystoscopy and SPARC pubovaginal sling.  SURGEON:  Jamison Neighbor, M.D.  ANESTHESIA:  General.  COMPLICATIONS:  None.  DRAINS:  A _____ 16-French Foley catheter to be removed in the recovery room.  BRIEF HISTORY:  This is a 77 year old female who has well-documented stress urinary incontinence.  The patient was scheduled for a [facility] procedure, but elected to be further evaluated by her medical physicians who requested that a cardiac catheterization be performed before they would give medical clearance. The patient had a cardiac catheterization that turned out to be normal and has now been approved for surgery.  The patient understands the risks and benefits of the procedure including the possibility that additional surgery will be needed to either loosen or tighten the sling.  She does know there is the risks of retention of erosion as well as infection.  She also knows that there is a risk for increased urgency and that there is certainly no indication that urge incontinence will be improved by a procedure of this nature.  She understands this is strictly being done for the stress component of her problems.  She gave full and informed consent.  DESCRIPTION OF PROCEDURE:  After the successful induction of general anesthesia, the patient was placed in the low lithotomy position, prepped with Betadine, and draped in the usual sterile fashion.  The labia were sutured ________ to the medial thigh and a weighted vaginal speculum was  placed.  The anterior vaginal mucosa was infiltrated with a local anesthetic.  The incision was made directly over the midportion of the urethra near the mid urethral complex.  The vaginal mucosa was elevated on each side, back towards the space of Retzius, but not entering the space of Retzius.  Puncture holes were made directly above the _______ three fingerbreadths apart.  The [facility] needle guide was passed from the abdominal incision down to the vaginal incision bilaterally.  Cystoscopy was done with both 12 degree and 70 degree lenses. The needles did not enter the bladder in any way.  There was no injury to the bladder.  The [facility] sling was then pulled up to the abdominal incision.  It was positioned so that an urethral dilator could be passed underneath the sling between the urethra and the sling.  The protective tape was pulled from around the sling, allowing it to be appropriately positioned.  The area was irrigated with antibiotic solution and was then closed with a 2-0 Vicryl suture.  The sling was cut off at the skin level.  The skin was closed with a single stitch of Vicryl and Steri-Strips.  The patient had a vaginal pack applied.  Labial stitches were removed.  The standard dressing was applied.  The patient tolerated the procedure well and was taken to the recovery room in good condition. Dictated by:   Jamison Neighbor, M.D. Attending Physician:  Londell Moh DD:  03/12/01 TD:  03/12/01 Job:  [id number]

## 2010-08-26 NOTE — Consult Note (Signed)
[place]. [facility]  Patient:    [name]                         MRN: [id number] Adm. Date:  [id number] Attending:  Marily Memos CC:         [name], M.D.  [name], M.D.  Reather Littler, M.D.   Consultation Report  HISTORY OF PRESENT ILLNESS:  [name] is a 77 year old female patient of Dr. Collins Mcintosh, who presented to the emergency room with bradycardia and hypotension and requested Dr. Tresa Endo, who takes care of family members.  This female is a hypertensive diabetic and has hypothyroidism following treatment for hyperthyroidism.  She apparently has had labile blood pressure in the past.  On the evening of admission, around 9:30 p.m., she took her normal medicines and at midnight began feeling weak, nausea, faint.  She took her blood pressure at home and it was in the 70s and her pulse was in the 40s.  She was brought to the emergency room by her daughter.  In the emergency room, her blood pressure was 64/31, heart rate was 44, and third-degree AV block with a narrow QRS.  T waves were noted but not in association with any QRSs.  She was given atropine 0.5 and then 1.0 mg IV without a substantial change in her heart rate.  In addition to this, she was given IV bolus and started on a dopamine drip.  Currently, her blood pressure is in the low 80s and her heart rate is 46-55.  She had no chest pain or diaphoresis with this but she still has some mild nausea.  As long as she is flat and supine, she is stable.  According to the patient, at home her blood pressure normally runs systolic blood pressures around 100 and heart rates in the 50s.  She cannot remember when she has seen her heart rate in the 60s.  She has had some nausea at home but nothing to the severity of tonights episode.  In addition to this relatively low blood pressure, she has had several presentations to the emergency room with profound hypertension and headaches with  pressures 230/110.  Her last presentation was about 18 months ago.  CURRENT MEDICATIONS:  1. Tiazac 240 mg q day.  2. Clonidine 0.2 mg b.i.d.  3. Lopressor 50 mg 2 tablets b.i.d.  4. Accupril 40 mg b.i.d.  5. Armour Thyroid 30 mg a day.  6. Cardura 4 mg a day.  7. Lasix 40 mg a day.  8. Enteric-coated aspirin once a day.  9. Humulin N 35 units in the morning and 10 in the evening. 10. Premarin 0.625. 11. ______ DM.  Her Lopressor dose was apparently increased around 10 weeks ago from 50 b.i.d. to 100 mg b.i.d.  ALLERGIES: 1. CODEINE. 2. MORPHINE.  PAST MEDICAL HISTORY:  Diabetes since 1979, hypertension for approximately 15 years, tubal pregnancy x 2, hysterectomy, nonmalignant tumor removed from her small intestine, fractures of both lower extremities, nonmalignant cyst removed from tongue.  SOCIAL HISTORY:  No alcohol, drugs, or cigarettes.  FAMILY HISTORY:  Noncontributory.  REVIEW OF SYSTEMS:  The patient denies any exertional chest pain.  It is felt that her stamina and weight have been stable.  She has seen on blood in her bowel movement and, according to her daughter, has been doing reasonably well at home.  Appetite is good.  No URI-type symptoms.  Bowel  habits normal.  PHYSICAL EXAMINATION:  GENERAL:  The patient is supine and her exam was done with her clearly in the supine position.  Skin warm and dry, complaining of mild nausea.  VITAL SIGNS:  Blood pressure 84/60 and pulse is now 52, still in third-degree AV block with narrow QRS.  LUNGS:  Clear.  No wheezing.  CARDIAC:  Regular rhythm with a bradycardic rate.  No gross murmur.  NECK:  Thyroid nonpalpable.  ABDOMEN:  Obese, nontender.  I could not hear abdominal bruits.  EXTREMITIES:  +2 pulses in the upper and lower extremities.  No edema.  NEUROLOGIC:  Intact.  The patient moves all four extremities to command.  Grip is equal bilaterally.  LABORATORY DATA:  Potassium 3.8, BUN and creatinine of  21 and 1.4.  CK is negative, troponin is negative.  EKG:  Third-degree AV block, narrow QRS.  No ST-T wave changes.  Chest x-ray: Slight increase in vascular markings.  ASSESSMENT: 1. Third-degree AV block with bradycardia and associated hypotension.  I    suspect this is all related to the combination of Tiazac, Lopressor, and    clonidine.  She has had little response to atropine or dopamine at this    point.  With the patient supine, her symptoms are minimal.  External    pacemaker plans have been placed but have not been used thus far.  We will    continue her on IV fluids, admit to coronary care.  We will check serial    enzymes.  I doubt at this point she will need a temporary or even a    permanent pacemaker, unless her arrhythmia/bradycardia worsens. 2. Diabetes mellitus. 3. History of hypertension.  I have currently held her Accupril because of her    relative hypotension at this point. DD:  08/16/00 TD:  08/16/00 Job: [id number]

## 2010-08-26 NOTE — Discharge Summary (Signed)
NAME:  [name], [name]                  ACCOUNT NO.:  [id number]   MEDICAL RECORD NO.:  [id number]          PATIENT TYPE:  OIB   LOCATION:  2905                         FACILITY:  [facility]   PHYSICIAN:  Cristy Hilts. Jacinto Halim, MD       DATE OF BIRTH:  1929-04-28   DATE OF ADMISSION:  12/06/2004  DATE OF DISCHARGE:  12/07/2004                                 DISCHARGE SUMMARY   HOSPITAL COURSE:  [name] is a 77 year old female patient of Dr.  Yates Decamp who came into the hospital for an outpatient peripheral vascular  angiogram.  She apparently has had symptoms of claudication and had abnormal  ABIs on the right.  She also had abnormal renal duplex with greater than 60%  on the left renal artery.  She underwent the percutaneous angiogram on  December 06, 2004; please see Dr. Verl Dicker note for complete details.  She  subsequently underwent right SFA balloon PTA.  On December 07, 2004, she was  in stable condition.  Blood pressure was elevated, but she had not been  given her blood pressure medications; her blood pressure was 172/50, her  pulse was 70, her respirations were 20, her O2 SATS were 95%.   LABORATORY DATA:  Hemoglobin 10.8, hematocrit 32.2, WBC 6.2, platelets were  274,000.  Sodium was 138, potassium was 4.1, glucose was 161, BUN 14,  creatinine 1.0, albumin 3.4.   DISCHARGE MEDICATIONS:  1.  Norvasc 5 mg one time per day.  2.  Cardura 8 mg two times per day.  3.  Armour Thyroid 60 mg one and a half one time per day.  4.  Lasix 40 mg one time per day.  5.  Pindolol 5 mg one in the a.m. and half in the p.m.  6.  ICAPS two times per day.  7.  Cosopt two times per day.  8.  Neurontin 100 mg at bedtime.  9.  Lescol 40 mg one time per day.  10. Pletal 100 mg two times per day.  11. Aspirin 81 mg two time per day.  12. Plavix 75 mg one time per day.  13. Novolin and Humalog insulin as taken previously.  14. Metoclopramide 10 mg a half a tablet before meals and one at bedtime.   FOLLOWUP:  She will come to the office at 11 a.m. in [place] to check her  volume.  She will have followup lower extremity Dopplers on December 30, 2004 at 4 p.m. and she will see Dr. Jacinto Halim on January 04, 2005 at 2:45.   ACTIVITY:  She should have no strenuous activity.   DIET:  She will resume her diabetic diet.   DISCHARGE DIAGNOSES:  1.  Claudication with abnormal ankle-brachial indices.  2.  Arteriosclerotic peripheral vascular disease, status post peripheral      vascular angiogram with subsequent superficial femoral artery      percutaneous transluminal angioplasty and stenting; please see Dr.      Verl Dicker note for further details.  Her right renal artery was patent; I  do not see mention of her left renal artery in the note.  3.  Resistant hypertension, on multiple medications.  4.  Insulin-dependent diabetic.  5.  Hypothyroidism.  6.  Hyperlipidemia.  7.  History of diastolic dysfunction.  8.  Chronic renal insufficiency.      Lezlie Octave, N.P.      Cristy Hilts. Jacinto Halim, MD  Electronically Signed    BB/MEDQ  D:  02/12/2005  T:  02/13/2005  Job:  [id number]

## 2010-08-26 NOTE — Cardiovascular Report (Signed)
NAME:  [name], [name]                  ACCOUNT NO.:  [id number]   MEDICAL RECORD NO.:  [id number]          PATIENT TYPE:  OIB   LOCATION:  2872                         FACILITY:  [facility]   PHYSICIAN:  Cristy Hilts. Jacinto Halim, MD       DATE OF BIRTH:  January 01, 1930   DATE OF PROCEDURE:  12/06/2004  DATE OF DISCHARGE:                              CARDIAC CATHETERIZATION   REFERRING PHYSICIAN:  Nicki Guadalajara, M.D.   PROCEDURE PERFORMED:  1.  Selective left and right renal arteriography.  2.  Right iliac arteriography with femoral runoff.  3.  Left iliac arteriography with femoral runoff.  4.  Percutaneous transluminal angioplasty of the right superficial femoral      artery.   INDICATIONS:  [name] is a 77 year old female with a history of  hypertension, diabetes, hyperlipidemia, who has been complaining of severe  lifestyle limiting claudication.  She states that she when she does go  shopping, all she has to do is slowly walk for about 10 minutes, and she  gets severe pain and cramping in the calf, right more than the left, and  then she has to stop.  She underwent lower arterial Dopplers, which revealed  a very high grade stenosis of the right SFA.   Because of her lifestyle limiting claudication, she was brought to the  peripheral angiography suite for evaluation of peripheral anatomy.  Renal  angiography is performed, as she had an abnormal renal duplex and also  because of uncontrolled or difficult to control hypertension.   ANGIOGRAPH:  We got an x-ray of her left femoral artery.  Left femoral  artery has an ostial 10-20% calcific stenosis, but the lumen is wide with no  pressure gradient.   RIGHT RENAL ARTERY:  The right renal artery is widely patent.  It is normal.   RIGHT ILIAC ARTERY WITH FEMORAL RUNOFF:  The right iliac artery with femoral  runoff revealed mild disease around the right SFA.  There was a high grade  mid 90% stenosis in the right SFA.  Otherwise, the vessels were  smooth, and  there was three-vessel runoff.   LEFT ILIAC ARTERY WITH FEMORAL RUNOFF:  The left iliac artery with femoral  runoff revealed smooth vessels.  The left posterior tibial artery had a  focal 90% stenosis; however, there was a three-vessel runoff noted in the  left lower extremity.   INTERVENTION DATA:  Successful PTA of the right SFA with a 5 x 20 mm  AGILTRAC balloon.  This was performed at a peak 6 atmospheres of pressure.  The stenosis was reduced from 90% to less than 10%.   RECOMMENDATIONS:  The patient should be continued on medical therapy only.  She will be followed up for restenosis.  The left lower extremity does not  need revascularization, as it is just a focal stenosis at the posterior  tibial artery.   TECHNIQUE/PROCEDURE:  In the usual sterile fashion using a 5 French left  femoral arterial access, a 5 French LIMA catheter was utilized to engage the  left and right renal arteries  and renal selective arteriography was  performed.  Then the same catheter was utilized to engage the right common  iliac artery, and right iliac arteriography was performed.  Then using an  olive wire to cross into the right SFA, a 6 French Terumo sheath was  utilized to cross over from the left femoral artery to the right superficial  femoral artery.  Arteriography was repeated.  Then having cross the SFA  stenosis, it was decided to proceed with intervention, as it was high-grade  anterior significant claudication.  At this time, 5000 units of heparin were  administered, and a 6 x 20 mm AGILTRAC balloon was utilized, and multiple  angioplasties, anywhere from 4 to 6 atmospheres of pressure were performed.  The post balloon angioplasty was less than 10% stenosis with minimal  dissection.  Excellent brisk flow was noted through the right lower  extremity with three vessel runoff.  After having confirmed success, the  attention was directed to the left iliac artery.  The left iliac  artery with  femoral runoff was performed through the arterial access sheath.   COMPLICATIONS:  No immediate complications were noted.  The Terumo sheath  was pulled out of the body prior to ACT being checked, and hence manual  pressure had to be held to obtain hemostasis.  Overall, the patient  tolerated the procedure.  A total of 75 cc of contrast was utilized for  diagnostic and interventional procedure.      Cristy Hilts. Jacinto Halim, MD  Electronically Signed     JRG/MEDQ  D:  12/06/2004  T:  12/06/2004  Job:  [id number]   cc:   Nicki Guadalajara, M.D.  [address]  [place], [place] [address]  Fax: [phone]   Tammy R. Collins Scotland, M.D.  [address] [place]  [place] [address]  Fax: [phone]

## 2010-08-26 NOTE — Op Note (Signed)
[name], [name]                  ACCOUNT NO.:  [id number]   MEDICAL RECORD NO.:  [id number]          PATIENT TYPE:  INP   LOCATION:  4733                         FACILITY:  [facility]   PHYSICIAN:  Darlin Priestly, MD  DATE OF BIRTH:  October 26, 1929   DATE OF PROCEDURE:  02/20/2006  DATE OF DISCHARGE:                                 OPERATIVE REPORT   PROCEDURE:  Insertion of a Medtronic Adapta generator, model number [id number],  serial number [id number] with passive atrial and ventricular leads.   ATTENDING:  Dr. Lenise Herald.   COMPLICATIONS:  None.   INDICATION:  Miss [name] is a 77 year old female patient of Dr. Daphene Jaeger and  Dr. Darvin Neighbours as well as Dr. Maryelizabeth Kaufmann with a history of hypertension,  history of sick sinus syndrome with intermittent complete heart block  secondary to negative chronotropic agents, history of diabetes, history of  PVD, status post SFA PTA in November '06, hypothyroidism, hyperlipidemia.  Recently found to have a left-sided renal mass.  She subsequently underwent  left nephrectomy.  She was noted to have ongoing sick sinus syndrome and is  now referred for pacer implant for her arrhythmia.   DESCRIPTION OF OPERATION:  After obtaining informed consent, patient was  brought to the cardiac cath lab where left chest was shaved, prepped, and  draped in sterile fashion.  Eighty cc of Monistat was secured.  Then 1%  lidocaine was used to anesthetize the left mid subclavicular area.  Next,  approximately 3 cm subclavicular horizontal incision was carried out, and  hemostasis was obtained with electrocautery.  Blunt dissection was used to  carry this down to the left pectoral fascia.  Next, approximately a 3 x 4-cm  pocket was then created over the left pectoral fascia, and again, hemostasis  was obtained with electrocautery.  The left subclavian vein was then  visualized with IV contrast and then entered with easy passage of a  guidewire into the SVC and right  atrium.  Over this, a 9-French dilator and  sheath were then inserted and the guidewire and dilator removed. Through  this, a 52-cm passive Medtronic lead, model number [id number], serial number  [id number], was then passed into the right atrium, and the guidewire was  retained, and the peel-away sheath was removed.  A second 7-French dilator  and sheath were then inserted over the retaining guidewire and the guidewire  and dilator removed.  Through this, a 45-cm passive Medtronic lead, model  number 5594, serial number [id number], was then put in place into the right  atrium, and the peel-away sheath was removed.  A J-Curve was then placed on  the ventricular lead stylet.  The ventricular lead was then allowed to cross  the tricuspid valve and positioned in the RV apex without difficulty.  Thresholds were determined.  R-waves were measured at 27 mV, impedance 749  ohms.  Threshold in the ventricle was 0.5 volts at 0.5 msec.  Current was  0.9 mA.  Ten volts were negative for diaphragmatic stimulation.  The atrial  lead was in positioned in  the right atrial appendage, and threshold was  undetermined.  PA pressure measured 4.7 mV.  Impedance was 449 ohms.  Threshold in the atrium was 0.7 volts at 0.5 msec.  Current was 1.9 mA.  Again, 10 volts were negative for diaphragmatic stimulation.  Then 2-0 silk  sutures were used to anchor each lead to the pectoral fascia.  The pocket  was then copiously irrigated with 1% Garamycin solution and, again,  hemostasis was confirmed.  The lead was then connected to a Medtronic Sigma  generator [id number], serial number [id number].  Head screw was tightened, and  placement was confirmed.  The leads were then connected in a serial fashion  to a Medtronic Adapta [id number] generator, serial number [id number].  Head  screws were tightened, and pacing was confirmed.  A single silk suture was  placed in the apex pocket.  The generator lead was then delivered into the   pocket, and the header was secured to the silk suture.  The subcutaneous  layer was then closed with running 2-0 Vicryl.  The skin layer was then  closed with running 4-0 Vicryl.  Steri-Strips were then applied.  The  patient was returned to recovery room in stable condition.   CONCLUSIONS:  Successful implant of a Medtronic Adapta ADDRO1 generator,  serial number [id number] with passive atrial and ventricular leads.      Darlin Priestly, MD  Electronically Signed     RHM/MEDQ  D:  02/20/2006  T:  02/21/2006  Job:  [id number]   cc:   Nicki Guadalajara, M.D.  Ronald L. Earlene Plater, M.D.  Reather Littler, M.D.

## 2010-08-26 NOTE — Discharge Summary (Signed)
[name], AUSBURN                  ACCOUNT NO.:  [id number]   MEDICAL RECORD NO.:  [id number]          PATIENT TYPE:  INP   LOCATION:  4733                         FACILITY:  [facility]   PHYSICIAN:  Delman Cheadle, MD       DATE OF BIRTH:  1930-03-13   DATE OF ADMISSION:  02/14/2006  DATE OF DISCHARGE:  02/25/2006                                 DISCHARGE SUMMARY   DISCHARGE DIAGNOSES:  1. Nephrectomy secondary to renal cancer.  2. Bradycardia during surgery with history of bradycardia in the past.  3. Sick sinus syndrome with placement of permanent transvenous Medtronic      pacemaker.  4. Hypertension, difficult to control.  5. Insulin-dependant diabetes mellitus.  6. Nausea secondary to medications.  7. History of peripheral vascular disease with percutaneous transluminal      angioplasty and stent to the superficial femoral artery.  8. Hyperthyroidism.  9. History of diastolic dysfunction.  10.Negative Persantine Myoview, November 27, 2005, with history of      nonobstructive coronary disease.   DISCHARGE CONDITION:  Stable.   ALLERGIES:  CODEINE causes nausea and vomiting.  MORPHINE causes nausea and  vomiting.  SYNTHROID has caused nausea, and now [facility] probably  increased her nausea during the hospitalization.   PROCEDURES:  1. February 14, 2006:  Left radical nephrectomy by Dr. Darvin Neighbours.  2. February 20, 2006:  Placement of a permanent pacemaker, Medtronic      Adapta DDIR placement, serial number [id number].   DISCHARGE MEDICATIONS:  1. Stop pindolol.  2. Stop Cardura.  3. Mysoline 50 mg twice a day.  4. Lescol 40 mg daily.  5. Armour Thyroid 60 mg 1-1/2 tabs to equal 90 mg daily.  6. Maxzide 37.5/25 one daily.  7. Norvasc 10 mg daily, which is a new dose, increased from 5.  8. Cosopt eye drops as before.  9. Xalatan eye drops as before.  10.Lantus insulin 20 units every evening, new dose.  11.Protonix 40 daily.  12.Clonidine 0.1 mg 3 times a day.  13.Reglan 10 mg 3 times a day.  14.Toprol XL 50 mg daily.  15.Aspirin, as before, 81 mg daily.  16.Promethazine 6.25 mg 1-2 as needed every 6 hours for nausea.  17.Quinapril 40 mg 2 daily.  18.Vicodin 1 every 4 hours p.r.n.  19.Darvocet-N 100 one or two every 4 hours.  If no relief with Darvocet,      do not use Darvocet anymore and just use Vicodin.  20.Follow up with Dr. Earlene Plater this week.  Call Monday for the appointment.  21.See Dr. Tresa Endo in 1-2 weeks.  The office should call you with      appointment date and time.  22.Follow up with primary doctor, Dr. Lucianne Muss, for diabetes management in      the next 1-2 weeks.   Note:  Patient was instructed to follow her Accu-Cheks at home, that she may  need to increase her insulin back up to pre-hospitalization amount secondary  to being at home and eating probably a different diet than she does in the  hospital.  Previously she had been on Lantus 40 plus NovoLog sliding scale.   HISTORY OF PRESENT ILLNESS:  A 77 year old white female admitted to the  hospital by Dr. Wilson Singer and Dr. Darvin Neighbours for a left-sided mass that was  originally found on Doppler ultrasound of her renal mass.  This was worked  up with CT and MRI revealing a large, approximately 9-cm mass involving the  left kidney.  She had some associated left lower quadrant pain, but she was  brought in at that time for nephrectomy.   PAST MEDICAL HISTORY:  See problem list.   PAST SURGICAL HISTORY:  1. Cholecystectomy.  2. Hysterectomy.  3. Appendectomy.  4. Vascular tumor removal.  5. Ankle surgery.  6. Sling bladder procedure.   FAMILY HISTORY/SOCIAL HISTORY/REVIEW OF SYSTEMS:  See H&P.   PHYSICAL EXAM:  VITAL SIGNS:  At discharge:  Blood pressure 143/58.  Pulse  66.  Respiratory rate is 18.  Temp 97.6.  Oxygen saturation on 2 liters is  98% on exam.  HEART:  Regular rate and rhythm.  Pacer site without hematoma.  She has some  ecchymosis.  LUNGS:  Without rales.  ABDOMEN:   Soft, nontender.  She has staples over her nephrectomy site that  were stable.   LABORATORY DATA:  Labs:  Hemoglobin 10.6 and hematocrit 31.6 and prior to  discharge hemoglobin 13.5, hematocrit 39.5, WBCs 6.8, platelets 293.  Pro  time 13.9, INR of 1.1, PTT 32.   Chemistry:  Sodium 133, potassium 3.9, chloride 95, CO2 of 31, glucose 183,  BUN 15, creatinine 1.1, calcium 9.0, amylase 36, lipase 18.  Sodium stayed  low prior to discharge at 132, potassium 4.4, BUN 17, creatinine 1.1.   RADIOLOGY:  Chest x-ray on the 13th of November:  Stable chest x-ray.  No  evidence of acute cardiac or pulmonary process.  Follow up post implant of  pacemaker.  Small loculated pleural effusion, posterior aspect of the chest,  probably on the left.  Otherwise no problems.  On chest x-ray on the 17th:  Pacer in satisfactory position.  Density seen  on the left heart border was not appreciated on previous films nor on a  recent CT.  This likely reflects just positioning of structures.  No  pneumothorax and no pulmonary edema.   EKG after the procedure was sinus rhythm at 60 with atrial pacing,  ventricular sensing.   Prior to that she had episodes of significant bradycardia and pauses.   HOSPITAL COURSE:  [name] was originally admitted on the 7th of November  for nephrectomy, which she had, and we had been called to see for  bradycardia.  She has bradycardia with vomiting and had been having multiple  episodes of vomiting since her surgery.  She had come off her clonidine and  beta-blockers with improvement of her bradycardia.  She is on pindolol for  blood pressure.  She was at [facility] when the consult was done.  IV nitrate was added for blood pressure control initially, and IV Vasotec.  No beta-blockers.  Patient continued to have bradycardia.  Glucose was up. We got a primary consult for diabetes management.  She then started having  pauses greater than 5 seconds with heart rate  slowing to 20-30.  She also  has a history of PAF.  She then eventually transferred to [facility] and  underwent permanent pacemaker on the 13th due to symptomatic bradycardia.  She tolerated the procedure well.  Postoperatively she had  a lot of pain.  She was placed on a fentanyl patch and then she had increase in nausea.  She  has chronic nausea, but it did get worse after the fentanyl.  She has a  history of nausea and vomiting with codeine as well as morphine, so the  fentanyl patch at discharge was discontinued.  She had been receiving Zofran  as an inpatient, but due to the expense this was switched to Phenergan and  hopefully with the decrease and/or stopping the fentanyl, the nausea will  subside.  She is on Reglan anyway for nausea.  She will follow up with Dr.  Tresa Endo as an outpatient.  At one point, the discussion was to send her to  rehab to increase her strength, but she stayed over the weekend and by  Sunday the 18th was really felt stable to be discharged home.  We did get  her a rolling walker to ambulate in her house better, and she was discharged  to the care of her daughter.      Darcella Gasman. Annie Paras, N.P.    ______________________________  Delman Cheadle, MD    LRI/MEDQ  D:  02/25/2006  T:  02/26/2006  Job:  [id number]   cc:   Windy Fast L. Earlene Plater, M.D.  Hollice Espy, M.D.  Reather Littler, M.D.  Everett Graff, N.P.

## 2010-08-26 NOTE — H&P (Signed)
[facility]  Patient:    [name], [name]                         MRN: [id number] Adm. Date:  [id number] Attending:  Marily Memos CC:         Thereasa Solo. Little, M.D.   History and Physical  DATE OF BIRTH:  Jan 04, 1930.  CHIEF COMPLAINT:  Low BPs when checked at home, does not feel right.  HISTORY OF PRESENT ILLNESS:  Ms. Conerly is a 77 year old white female with a longstanding history of insulin-dependent type 2 diabetes and poorly-controlled hypertension, hypothyroidism.  She has had a recent increase in her Lopressor, and the dose is uncertain at this time.  Prior to tonight, she has had one episode with systolic blood pressure around 100, at which time she felt weak, but reports systolic blood pressure in the 160s most mornings. Last night she took her medication, and shortly afterward she just did not feel right, felt weak.  Systolic blood pressure around 92-106, a repeat around 84, checked on the other arm it was 106.  She came to the emergency room and was noted to have a blood pressure of 64/31 and a pulse of 44.  She has no complaints of chest pain or shortness of breath.  Did complain of some left arm soreness prior, which is now improved.  PAST MEDICAL HISTORY:  Hypertension, insulin-dependent type 2 diabetes, hypothyroidism, status post hysterectomy.  ALLERGIES:  CODEINE causes nausea, MORPHINE causes swelling.  MEDICATIONS:  Tiazac 240 mg one p.o. q.d., Humulin N 35 units q.a.m. and 10 units q.p.m., clonidine 0.2 mg b.i.d., question Armour thyroid 30 q.d., Timoptic one drop OU q.p.m., Lopressor questionable dosage is 50 versus 100 mg b.i.d., Accupril 40 mg b.i.d., Amibid 30-600 CR one p.o. q.d., Cardura 4 mg q.d., Lasix 40 mg q.d., enteric coated aspirin one p.o. q.d., Premarin 0.625 mg one p.o. q.d.  FAMILY HISTORY:  Positive for CAD in both parents, diabetes in cousin, father, and paternal uncles.  Bladder cancer, renal failure, and  depression in her mother.  SOCIAL HISTORY:  No tobacco, alcohol, or drugs.  REVIEW OF SYSTEMS:  No complaints of headaches or visual changes.  No shortness of breath or cough.  Does complain of dry mouth.  No chest pains or palpitations.  Has developed nausea while in the ICU with some vomiting, some stomach upset.  Does have recurrent diarrhea, which has been worked up within the past year by a gastroenterologist.  No melena or hematochezia.  No hematemesis.  No dysuria.  Has had some problems with incontinence for the past two or three weeks, more than her usual.  Occasional edema of the left lower extremity where she has had prior fracture, it has not flared up recently.  No fevers.  Appetite has been good.  PHYSICAL EXAMINATION:  VITAL SIGNS:  Currently 97.6, 100/30, rate 60s, respiratory rate 20, pulse oximetry 97% on 4 L.  GENERAL:  Weak.  SKIN:  Warm and dry.  HEENT:  TMs are clear.  There is a scant amount of cerumen in the canals.  No erythema or fluid.  Eyes:  EOMI.  Pupils somewhat dilated but reactive to light bilaterally.  Fundi without lesions.  Oropharynx is clear, slightly dry. No erythema or exudate.  NECK:  Supple.  No TM, adenopathy, JVD, or bruits.  CHEST:  Lungs clear to auscultation bilaterally.  No wheezes or crackles.  CARDIAC:  Decreased heart sounds.  Regular rate and rhythm, S1, S2.  No MHR.  ABDOMEN:  Slightly decreased bowel sounds.  NTND.  No HSM, no masses.  GENITOURINARY:  Deferred.  Health maintenance up to date as an outpatient.  RECTAL:  Guaiac-negative per Smitty Cords. Beverely Pace, M.D., in ER.  EXTREMITIES:  1+ radial pulses, trace PT pulses, no CCE.  NEUROLOGIC:  Alert.  Cranial nerves II-XII intact.  5/5 motor.  Trace DTRs bilaterally.  LABORATORY DATA:  WBC 9.1, hemoglobin 11.3, hematocrit 33.5, platelets 263. Sodium 139, potassium 3.8, chloride 102, bicarb 28, BUN 21, creatinine 1.4, glucose 137.  CK 161, MB 1.3.  EKG showing junctional  rhythm, rate 40s.  ASSESSMENT/PLAN:  Hypotension/bradycardia, third degree heart block secondary to antihypertensive medications.  Dr. Darrol Poke consult is appreciated. Medications have been held.  She has an external pacer if needed.  Follow closely in the ICU, also with a dopamine drip.  Her blood pressure has been very difficult to control as an outpatient and with her longstanding diabetes, it is especially important for her to have a goal of 130/80 or less.  Because of this and current problems with medication combinations, I would like Dr. Darrol Poke help after discharge with management of her hypertension. DD:  08/16/00 TD:  08/16/00 Job: [id number]

## 2010-09-20 ENCOUNTER — Ambulatory Visit (HOSPITAL_COMMUNITY)
Admission: RE | Admit: 2010-09-20 | Discharge: 2010-09-20 | Disposition: A | Discharge status: Home / Self Care | Payer: Medicare Other | Source: Ambulatory Visit | Attending: Internal Medicine | Admitting: Internal Medicine

## 2010-09-20 ENCOUNTER — Other Ambulatory Visit (HOSPITAL_COMMUNITY): Payer: Self-pay

## 2010-09-20 ENCOUNTER — Encounter (HOSPITAL_COMMUNITY): Payer: Self-pay

## 2010-09-20 DIAGNOSIS — R197 Diarrhea, unspecified: Secondary | ICD-10-CM | POA: Insufficient documentation

## 2010-09-20 DIAGNOSIS — C649 Malignant neoplasm of unspecified kidney, except renal pelvis: Secondary | ICD-10-CM | POA: Insufficient documentation

## 2010-09-20 DIAGNOSIS — Z950 Presence of cardiac pacemaker: Secondary | ICD-10-CM | POA: Insufficient documentation

## 2010-09-20 DIAGNOSIS — Z905 Acquired absence of kidney: Secondary | ICD-10-CM | POA: Insufficient documentation

## 2010-09-20 DIAGNOSIS — I2510 Atherosclerotic heart disease of native coronary artery without angina pectoris: Secondary | ICD-10-CM | POA: Insufficient documentation

## 2010-09-20 DIAGNOSIS — J984 Other disorders of lung: Secondary | ICD-10-CM | POA: Insufficient documentation

## 2010-09-20 DIAGNOSIS — Z85528 Personal history of other malignant neoplasm of kidney: Secondary | ICD-10-CM

## 2010-09-20 DIAGNOSIS — M549 Dorsalgia, unspecified: Secondary | ICD-10-CM | POA: Insufficient documentation

## 2010-09-20 DIAGNOSIS — I517 Cardiomegaly: Secondary | ICD-10-CM | POA: Insufficient documentation

## 2010-09-20 DIAGNOSIS — N289 Disorder of kidney and ureter, unspecified: Secondary | ICD-10-CM | POA: Insufficient documentation

## 2010-09-20 HISTORY — DX: Essential (primary) hypertension: I10

## 2010-09-20 HISTORY — DX: Disorder of kidney and ureter, unspecified: N28.9

## 2010-09-20 HISTORY — DX: Malignant (primary) neoplasm, unspecified: C80.1

## 2010-09-27 ENCOUNTER — Other Ambulatory Visit: Payer: Self-pay | Admitting: Internal Medicine

## 2010-09-27 ENCOUNTER — Encounter (HOSPITAL_BASED_OUTPATIENT_CLINIC_OR_DEPARTMENT_OTHER): Payer: Medicare Other | Admitting: Internal Medicine

## 2010-09-27 DIAGNOSIS — C649 Malignant neoplasm of unspecified kidney, except renal pelvis: Secondary | ICD-10-CM

## 2010-09-27 DIAGNOSIS — Z85528 Personal history of other malignant neoplasm of kidney: Secondary | ICD-10-CM

## 2010-09-27 LAB — COMPREHENSIVE METABOLIC PANEL
ALT: 20 U/L (ref 0–35)
AST: 19 U/L (ref 0–37)
Albumin: 3.9 g/dL (ref 3.5–5.2)
CO2: 24 mEq/L (ref 19–32)
Calcium: 9.1 mg/dL (ref 8.4–10.5)
Chloride: 96 mEq/L (ref 96–112)
Potassium: 4.9 mEq/L (ref 3.5–5.3)
Sodium: 131 mEq/L — ABNORMAL LOW (ref 135–145)
Total Protein: 6.5 g/dL (ref 6.0–8.3)

## 2010-09-27 LAB — LACTATE DEHYDROGENASE: LDH: 225 U/L (ref 94–250)

## 2010-09-27 LAB — CBC WITH DIFFERENTIAL/PLATELET
BASO%: 0.7 % (ref 0.0–2.0)
EOS%: 8.8 % — ABNORMAL HIGH (ref 0.0–7.0)
HCT: 37.1 % (ref 34.8–46.6)
MCH: 31.7 pg (ref 25.1–34.0)
MCHC: 33.9 g/dL (ref 31.5–36.0)
MONO#: 0.6 10*3/uL (ref 0.1–0.9)
RDW: 13.2 % (ref 11.2–14.5)
WBC: 6.6 10*3/uL (ref 3.9–10.3)
lymph#: 1.8 10*3/uL (ref 0.9–3.3)

## 2012-06-08 DEATH — deceased

## 2013-09-08 ENCOUNTER — Encounter: Payer: Self-pay | Admitting: Cardiology

## 2013-09-10 ENCOUNTER — Telehealth: Payer: Self-pay | Admitting: Cardiovascular Disease

## 2013-09-10 NOTE — Telephone Encounter (Signed)
PT DECEASED/June 07, 2012/PER OBITUARIES/MT

## 2013-09-24 ENCOUNTER — Telehealth: Payer: Self-pay

## 2013-09-24 NOTE — Telephone Encounter (Signed)
Patient past away @ [facility] in [place]
